# Patient Record
Sex: Female | Born: 2000 | Race: White | Hispanic: No
Health system: Southern US, Community
[De-identification: ages and names within clinical notes are randomized; demographics above are authoritative.]

---

## 2020-05-09 ENCOUNTER — Emergency Department (HOSPITAL_COMMUNITY)
Admission: EM | Admit: 2020-05-09 | Discharge: 2020-05-10 | Disposition: A | Discharge status: Home / Self Care | Payer: BC Managed Care – PPO | Attending: Emergency Medicine | Admitting: Emergency Medicine

## 2020-05-09 ENCOUNTER — Other Ambulatory Visit: Payer: Self-pay

## 2020-05-09 DIAGNOSIS — R42 Dizziness and giddiness: Secondary | ICD-10-CM | POA: Diagnosis present

## 2020-05-09 DIAGNOSIS — R110 Nausea: Secondary | ICD-10-CM | POA: Diagnosis not present

## 2020-05-09 LAB — COMPREHENSIVE METABOLIC PANEL
ALT: 22 U/L (ref 0–44)
AST: 20 U/L (ref 15–41)
Albumin: 4 g/dL (ref 3.5–5.0)
Alkaline Phosphatase: 65 U/L (ref 38–126)
Anion gap: 10 (ref 5–15)
BUN: 6 mg/dL (ref 6–20)
CO2: 24 mmol/L (ref 22–32)
Calcium: 9.5 mg/dL (ref 8.9–10.3)
Chloride: 101 mmol/L (ref 98–111)
Creatinine, Ser: 0.65 mg/dL (ref 0.44–1.00)
GFR, Estimated: >60 mL/min (ref >60)
Glucose, Bld: 98 mg/dL (ref 70–99)
Potassium: 3.7 mmol/L (ref 3.5–5.1)
Sodium: 135 mmol/L (ref 135–145)
Total Bilirubin: 0.5 mg/dL (ref 0.3–1.2)
Total Protein: 7.3 g/dL (ref 6.5–8.1)

## 2020-05-09 LAB — CBC
HCT: 42.9 % (ref 36.0–46.0)
Hemoglobin: 13.9 g/dL (ref 12.0–15.0)
MCH: 29 pg (ref 26.0–34.0)
MCHC: 32.4 g/dL (ref 30.0–36.0)
MCV: 89.4 fL (ref 80.0–100.0)
Platelets: 431 10*3/uL — ABNORMAL HIGH (ref 150–400)
RBC: 4.8 MIL/uL (ref 3.87–5.11)
RDW: 12.4 % (ref 11.5–15.5)
WBC: 11.4 10*3/uL — ABNORMAL HIGH (ref 4.0–10.5)
nRBC: 0 % (ref 0.0–0.2)

## 2020-05-09 LAB — I-STAT BETA HCG BLOOD, ED (MC, WL, AP ONLY): I-stat hCG, quantitative: <5 m[IU]/mL (ref <5)

## 2020-05-09 MED ORDER — ONDANSETRON 4 MG PO TBDP
4.0000 mg | ORAL_TABLET | Freq: Once | ORAL | Status: AC | PRN
  Administered 2020-05-09: 4 mg via ORAL
  Filled 2020-05-09: qty 1

## 2020-05-09 NOTE — ED Provider Notes (Addendum)
[facility] EMERGENCY DEPARTMENT Provider Note   CSN: [id number] Arrival date & time: 05/09/20  1712     History Chief Complaint  Patient presents with  . Dizziness  . Nausea    Wanda Wells is a 20 y.o. female with no pertinent past medical history.  Patient presents with chief complaint of dizziness.  Patient reports that her dizziness started approximately 1500 today.  Patient states that she went for a walk around [facility] for an hour and after returning to her room developed dizziness.  Patient reports that she had associated nausea and felt like her coordination was off.  Patient reports that at this time she also noticed a "knot," on her scalp.  Patient reports that this knot was tender to palpation.  Patient reports that her symptoms have been persistent since onset however have gradually improved.  Dizziness is worse whenever she moves her head or changes position.  Patient denies any facial asymmetry, numbness, weakness, seizures, loss of consciousness, headache, tremors, chest pain, shortness of breath, abdominal pain, vomiting.  HPI     No past medical history on file.  There are no problems to display for this patient.     OB History   No obstetric history on file.     No family history on file.     Home Medications Prior to Admission medications   Not on File    Allergies    Hydrocodone  Review of Systems   Review of Systems  Constitutional: Negative for chills and fever.  Eyes: Positive for visual disturbance.  Respiratory: Negative for shortness of breath.   Cardiovascular: Negative for chest pain and leg swelling.  Gastrointestinal: Positive for nausea. Negative for abdominal pain and vomiting.  Genitourinary: Negative for difficulty urinating, dysuria, frequency and hematuria.  Musculoskeletal: Negative for back pain and neck pain.  Skin: Negative for color change and rash.  Neurological: Positive for dizziness. Negative  for tremors, seizures, syncope, facial asymmetry, speech difficulty, weakness, light-headedness, numbness and headaches.  Psychiatric/Behavioral: Negative for confusion.    Physical Exam Updated Vital Signs BP (!) 147/95 (BP Location: Left Arm)   Pulse 89   Temp 99 F (37.2 C) (Oral)   Resp 16   SpO2 100%   Physical Exam Vitals and nursing note reviewed.  Constitutional:      General: She is not in acute distress.    Appearance: She is not ill-appearing, toxic-appearing or diaphoretic.  HENT:     Head: Normocephalic and atraumatic. No raccoon eyes, abrasion, contusion, masses, right periorbital erythema, left periorbital erythema or laceration.     Jaw: No trismus or pain on movement.     Comments: Patient has minimal erythema to top of scalp with tenderness to palpation.  No swelling, fluctuance, induration, wound, or contusion.    Mouth/Throat:     Pharynx: Oropharynx is clear. Uvula midline. No pharyngeal swelling, oropharyngeal exudate, posterior oropharyngeal erythema or uvula swelling.  Eyes:     General: No scleral icterus.       Right eye: No discharge.        Left eye: No discharge.     Extraocular Movements: Extraocular movements intact.     Conjunctiva/sclera:     Right eye: Right conjunctiva is not injected. No chemosis, exudate or hemorrhage.    Left eye: Left conjunctiva is not injected. No chemosis, exudate or hemorrhage.    Pupils: Pupils are equal, round, and reactive to light.  Cardiovascular:     Rate  and Rhythm: Normal rate.  Pulmonary:     Effort: Pulmonary effort is normal. No tachypnea, bradypnea or respiratory distress.     Breath sounds: Normal breath sounds. No stridor.  Abdominal:     Palpations: Abdomen is soft.     Tenderness: There is no abdominal tenderness.  Musculoskeletal:     Cervical back: Normal range of motion and neck supple. No swelling, edema, deformity, erythema, signs of trauma, lacerations, rigidity, spasms, torticollis,  tenderness, bony tenderness or crepitus. No pain with movement. Normal range of motion.     Right lower leg: No swelling or tenderness. No edema.     Left lower leg: No swelling or tenderness. No edema.  Skin:    General: Skin is warm and dry.  Neurological:     General: No focal deficit present.     Mental Status: She is alert.     GCS: GCS eye subscore is 4. GCS verbal subscore is 5. GCS motor subscore is 6.     Cranial Nerves: No cranial nerve deficit or facial asymmetry.     Sensory: Sensation is intact.     Motor: No weakness, tremor or seizure activity.     Coordination: Romberg sign negative. Finger-Nose-Finger Test normal.     Gait: Gait abnormal.     Comments: CN II-XII intact, equal grip strength, +5 strength to bilateral upper and lower extremities   Patient's gait is unsteady she is cautious to move her head    Psychiatric:        Behavior: Behavior is cooperative.     ED Results / Procedures / Treatments   Labs (all labs ordered are listed, but only abnormal results are displayed) Labs Reviewed  CBC - Abnormal; Notable for the following components:      Result Value   WBC 11.4 (*)    Platelets 431 (*)    All other components within normal limits  COMPREHENSIVE METABOLIC PANEL  I-STAT BETA HCG BLOOD, ED ([facility], [facility], [facility] ONLY)    EKG EKG Interpretation  Date/Time:  Thursday May 09 2020 18:09:22 EDT Ventricular Rate:  83 PR Interval:  122 QRS Duration: 92 QT Interval:  364 QTC Calculation: 427 R Axis:   23 Text Interpretation: Sinus rhythm with marked sinus arrhythmia Cannot rule out Anterior infarct , age undetermined Abnormal ECG No previous ECGs available Confirmed by Alvira Monday ([id number]) on 05/09/2020 9:03:55 PM   Radiology No results found.  Procedures Procedures   Medications Ordered in ED Medications  ondansetron (ZOFRAN-ODT) disintegrating tablet 4 mg (4 mg Oral Given 05/09/20 1801)    ED Course  I have reviewed the triage vital signs and  the nursing notes.  Pertinent labs & imaging results that were available during my care of the patient were reviewed by me and considered in my medical decision making (see chart for details).    MDM Rules/Calculators/A&P                          Alert 20 year old female no acute distress, nontoxic-appearing.  Patient presents with chief complaint of dizziness.  She reports associated nausea and decreased coordination with the onset of dizziness.  Patient also reports that she noticed a "knot," to the top of her head when this dizziness started.  Patient reports that dizziness is worse with movement.  On physical exam patient has no focal neurological deficits.  Patient does have a unsteady gait.  She is cautious to move her head  due to complaints of worsening dizziness.  Lab work and EKG obtained while patient was in triage.  CBC shows slight leukocytosis at 11.4, no signs of anemia. CMP is unremarkable. Pregnancy test negative. EKG shows sinus rhythm with sinus arrhythmia.  On serial reexaminations patient is in no acute distress.  Patient able to move all limbs equally, no facial asymmetry or slurred speech.  Patient was discussed with and evaluated by Dr. Dalene Seltzer who recommended, MRI brain with and without contrast.  MRI pending at this time.  If MRI is unremarkable will discharge patient with trial of meclizine and follow-up with PCP.  Patient care transferred to PA Sanders at the end of my shift. Patient presentation, ED course, and plan of care discussed with review of all pertinent labs and imaging. Please see his/her note for further details regarding further ED course and disposition.   Final Clinical Impression(s) / ED Diagnoses Final diagnoses:  None    Rx / DC Orders ED Discharge Orders    None       Haskel Schroeder, PA-C 05/10/20 [address] [place] 05/10/20 1032    Alvira Monday, MD 05/10/20 [phone]

## 2020-05-09 NOTE — ED Provider Notes (Signed)
Emergency Medicine Provider Triage Evaluation Note  Wanda Wells, a 20 y.o. female evaluatedin triage.  Pt complains of dizziness.  Walked around campus and made it worse.  Also has some nausea.  No syncope, no CP or SOB.  BP (!) 147/95 (BP Location: Left Arm)   Pulse 89   Temp 99 F (37.2 C) (Oral)   Resp 16   SpO2 100%   Patient is alert, no acute distress, normal work of breathing    Medically screening exam initiated at 6:01 PM. Appropriate orders placed.  Oaklee Esther was informed that the remainder of the evaluation will be completed by another provider, this initial triage assessment does not replace that evaluation, and the importance of remaining in the ED until their evaluation is complete.       Valetta Mulroy, [place] N, PA-C 05/09/20 Murvin Natal, MD 05/13/20 [phone]

## 2020-05-09 NOTE — ED Triage Notes (Addendum)
Pt reports an "knot on head" and dizziness and states she was walking outside around campus and went inside and states a really bad pain "burning"  where the knot is and states she has not been able to walk due to dizziness/ nausea

## 2020-05-10 ENCOUNTER — Emergency Department (HOSPITAL_COMMUNITY): Payer: BC Managed Care – PPO

## 2020-05-10 MED ORDER — ONDANSETRON 4 MG PO TBDP: 4.0000 mg | ORAL_TABLET | Freq: Three times a day (TID) | ORAL | 0 refills | Status: AC | PRN

## 2020-05-10 MED ORDER — MECLIZINE HCL 25 MG PO TABS: 25.0000 mg | ORAL_TABLET | Freq: Three times a day (TID) | ORAL | 0 refills | Status: AC | PRN

## 2020-05-10 MED ORDER — GADOBUTROL 1 MMOL/ML IV SOLN
9.0000 mL | Freq: Once | INTRAVENOUS | Status: AC | PRN
  Administered 2020-05-10: 9 mL via INTRAVENOUS

## 2020-05-10 NOTE — ED Notes (Signed)
Patient taken to MRI

## 2020-05-10 NOTE — ED Provider Notes (Signed)
Assumed care of patient at shift change from [facility].  See prior notes for full H&P.  Briefly, 20 y.o. F here with nausea/dizziness.  No focal deficits but some concern for unsteady gait.  Labs reassuring.  Plan:  MRI brain pending.  dispo per results.  Results for orders placed or performed during the hospital encounter of 05/09/20  Comprehensive metabolic panel  Result Value Ref Range   Sodium 135 135 - 145 mmol/L   Potassium 3.7 3.5 - 5.1 mmol/L   Chloride 101 98 - 111 mmol/L   CO2 24 22 - 32 mmol/L   Glucose, Bld 98 70 - 99 mg/dL   BUN 6 6 - 20 mg/dL   Creatinine, Ser 8.45 0.44 - 1.00 mg/dL   Calcium 9.5 8.9 - 36.4 mg/dL   Total Protein 7.3 6.5 - 8.1 g/dL   Albumin 4.0 3.5 - 5.0 g/dL   AST 20 15 - 41 U/L   ALT 22 0 - 44 U/L   Alkaline Phosphatase 65 38 - 126 U/L   Total Bilirubin 0.5 0.3 - 1.2 mg/dL   GFR, Estimated >68 >03 mL/min   Anion gap 10 5 - 15  CBC  Result Value Ref Range   WBC 11.4 (H) 4.0 - 10.5 K/uL   RBC 4.80 3.87 - 5.11 MIL/uL   Hemoglobin 13.9 12.0 - 15.0 g/dL   HCT 21.2 24.8 - 25.0 %   MCV 89.4 80.0 - 100.0 fL   MCH 29.0 26.0 - 34.0 pg   MCHC 32.4 30.0 - 36.0 g/dL   RDW 03.7 04.8 - 88.9 %   Platelets 431 (H) 150 - 400 K/uL   nRBC 0.0 0.0 - 0.2 %  I-Stat beta hCG blood, ED  Result Value Ref Range   I-stat hCG, quantitative <5.0 <5 mIU/mL   Comment 3           MR Brain W and Wo Contrast  Result Date: 05/10/2020 CLINICAL DATA:  Initial evaluation for acute vertigo. EXAM: MRI HEAD WITHOUT AND WITH CONTRAST TECHNIQUE: Multiplanar, multiecho pulse sequences of the brain and surrounding structures were obtained without and with intravenous contrast. CONTRAST:  26mL GADAVIST GADOBUTROL 1 MMOL/ML IV SOLN COMPARISON:  None. FINDINGS: Brain: Cerebral volume within normal limits for patient age. No focal parenchymal signal abnormality identified. No abnormal foci of restricted diffusion to suggest acute or subacute ischemia. Gray-white matter differentiation  well maintained. No encephalomalacia to suggest chronic infarction. No foci of susceptibility artifact to suggest acute or chronic intracranial hemorrhage. No mass lesion, midline shift or mass effect. No hydrocephalus. No extra-axial fluid collection. Major dural sinuses are grossly patent. Empty sella. Suprasellar region normal. Midline structures intact and otherwise normal. No abnormal enhancement. Vascular: Major intracranial vascular flow voids well maintained and normal in appearance. Skull and upper cervical spine: Craniocervical junction normal. Visualized upper cervical spine within normal limits. Diffusely decreased T1 signal intensity seen within the visualized bone marrow, nonspecific, but most commonly related to anemia, smoking, or obesity. No focal marrow replacing lesion. No scalp soft tissue abnormality. Sinuses/Orbits: Globes and orbital soft tissues within normal limits. Paranasal sinuses are clear. No mastoid effusion. Inner ear structures normal. Other: None. IMPRESSION: 1. No acute intracranial abnormality. 2. Empty sella. While this finding is often incidental in nature and of no clinical significance, this can also be seen in the setting of idiopathic intracranial hypertension. 3. Otherwise unremarkable and normal brain MRI. Electronically Signed   By: Rise Mu M.D.   On: 05/10/2020 02:40  MRI without acute findings.  Suspect peripheral vertigo.  Will discharge home with meclizine and neurology follow-up if needed.  Return here for new concerns.   Garlon Hatchet, PA-C 05/10/20 0251    Pollyann Savoy, MD 05/10/20 [phone]

## 2020-05-10 NOTE — ED Notes (Signed)
Patient returned from MRI.

## 2020-05-10 NOTE — Discharge Instructions (Signed)
Take the prescribed medication as directed. Follow-up with neurology if symptoms not improving. Return to the ED for new or worsening symptoms.

## 2022-07-04 IMAGING — MR MR HEAD WO/W CM
14 of 16 series · 40 of 48 positions shown · IV contrast (gadavist)
Comparison: None.

CLINICAL DATA: Initial evaluation for acute vertigo.

EXAM:
MRI HEAD WITHOUT AND WITH CONTRAST
TECHNIQUE: Multiplanar, multiecho pulse sequences of the brain and surrounding
structures were obtained without and with intravenous contrast.
CONTRAST:  9mL GADAVIST GADOBUTROL 1 MMOL/ML IV SOLN

[Series 5: DWI · axial · 3.0mm · 0.88mm/px · z∈[-124,+15]mm · 6 of 96 slices shown (1 of 4)]
[im 1/96]
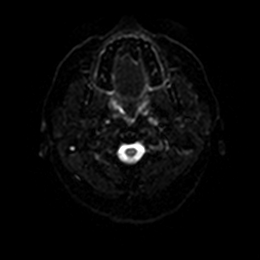
[im 20/96]
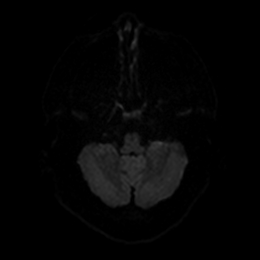
[im 39/96]
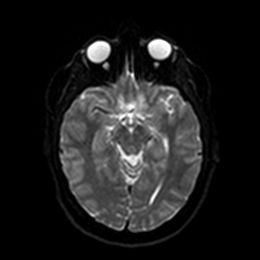
[im 58/96]
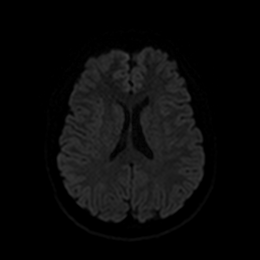
[im 77/96]
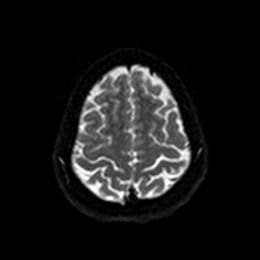
[im 96/96]
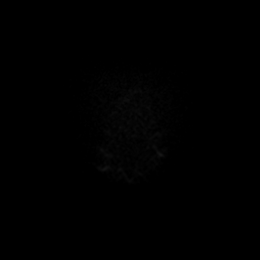

[Series 6: DWI · axial · 3.0mm · 0.88mm/px · z∈[-124,+15]mm · 2 of 48 slices shown (2 of 4)]
[im 1/48]
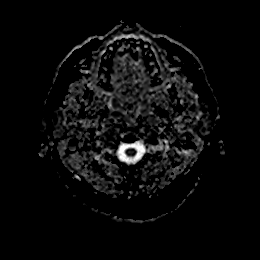
[im 48/48]
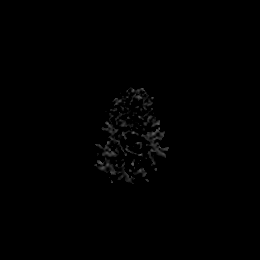

[Series 7: DWI · coronal · 4.0mm · 0.88mm/px · 4 of 68 slices shown (3 of 4)]
[im 1/68]
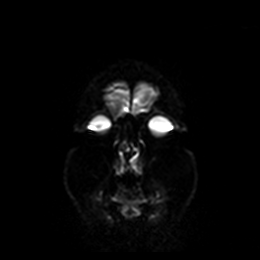
[im 23/68]
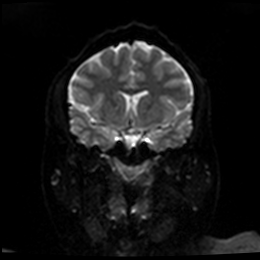
[im 45/68]
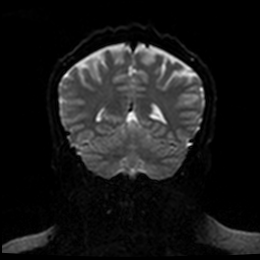
[im 68/68]
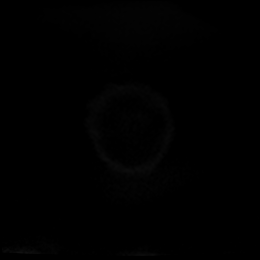

[Series 8: DWI · coronal · 4.0mm · 0.88mm/px · 2 of 34 slices shown (4 of 4)]
[im 1/34]
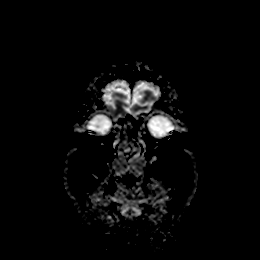
[im 34/34]
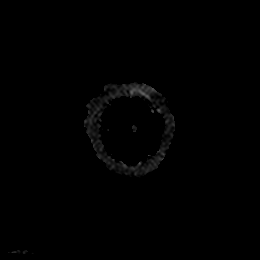

[Series 9: T1 · sagittal · 5.0mm · 0.75mm/px · 2 of 23 slices shown]
[im 1/23]
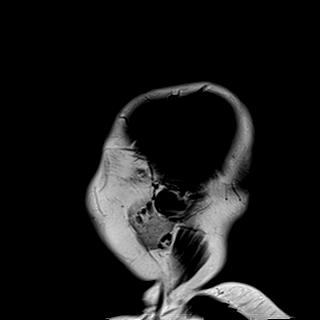
[im 23/23]
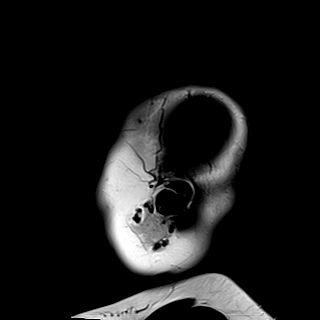

[Series 10: T2 · axial · 5.0mm · 0.72mm/px · z∈[-126,+17]mm · 2 of 25 slices shown]
[im 1/25]
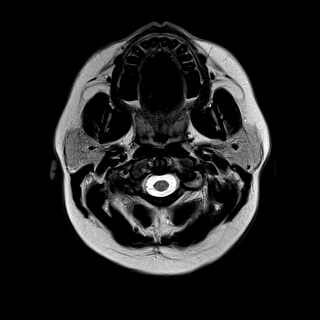
[im 25/25]
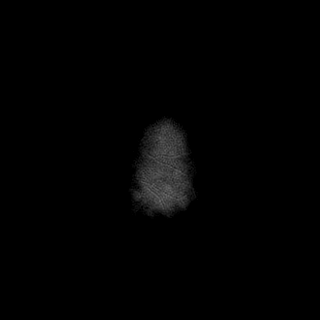

[Series 11: FLAIR · axial · 5.0mm · 0.45mm/px · z∈[-125,+17]mm · 2 of 25 slices shown]
[im 1/25]
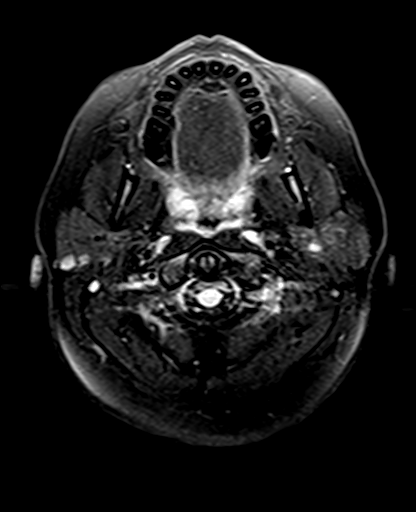
[im 25/25]
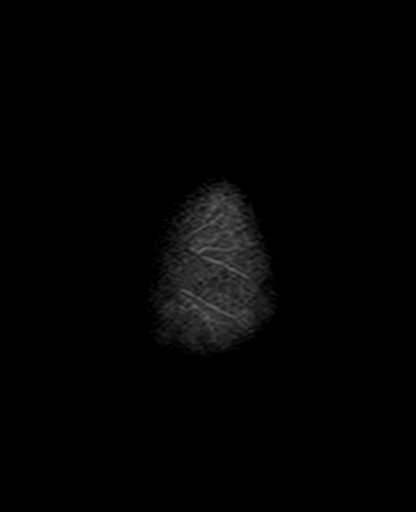

[Series 12: mag_images · axial · 3.0mm · 0.90mm/px · z∈[-130,+22]mm · 4 of 52 slices shown]
[im 1/52]
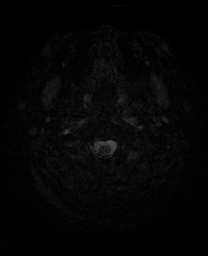
[im 18/52]
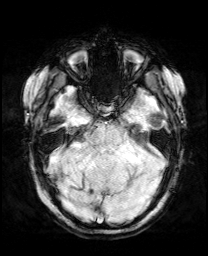
[im 35/52]
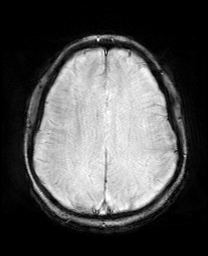
[im 52/52]
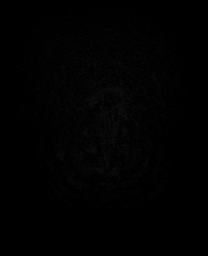

[Series 13: pha_images · axial · 3.0mm · 0.90mm/px · z∈[-130,+16]mm · 3 of 50 slices shown]
[im 1/50]
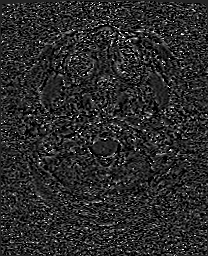
[im 25/50]
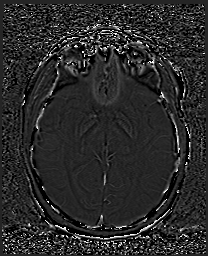
[im 50/50]
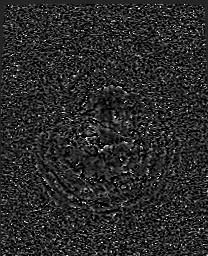

[Series 14: swi_images · axial · 3.0mm · 0.90mm/px · z∈[-130,+22]mm · 4 of 52 slices shown]
[im 1/52]
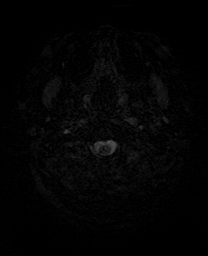
[im 18/52]
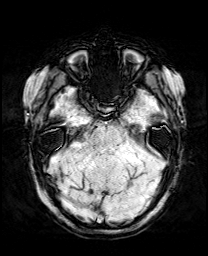
[im 35/52]
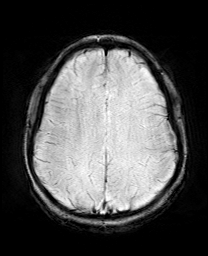
[im 52/52]
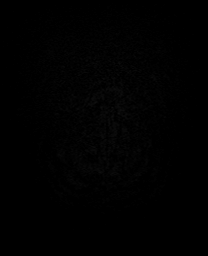

[Series 15: mip_images(sw) · axial · 24.0mm · 0.90mm/px · z∈[-119,+11]mm · 3 of 45 slices shown]
[im 1/45]
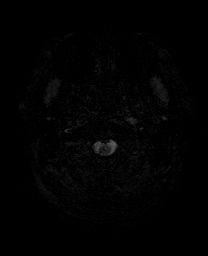
[im 23/45]
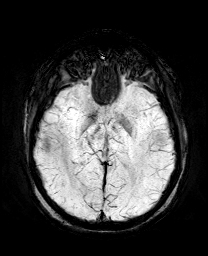
[im 45/45]
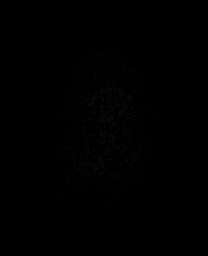

[Series 17: T2 post-contrast · coronal · 5.0mm · 0.72mm/px · 2 of 28 slices shown]
[im 1/28]
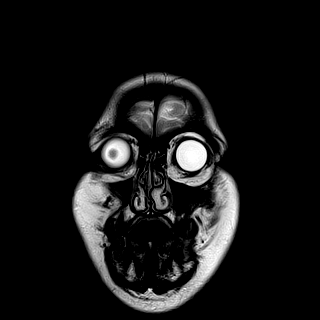
[im 28/28]
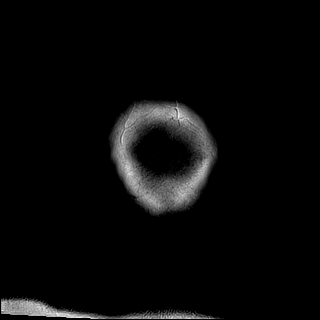

[Series 19: T1 post-contrast · coronal · 5.0mm · 0.34mm/px · 2 of 28 slices shown (1 of 2)]
[im 1/28]
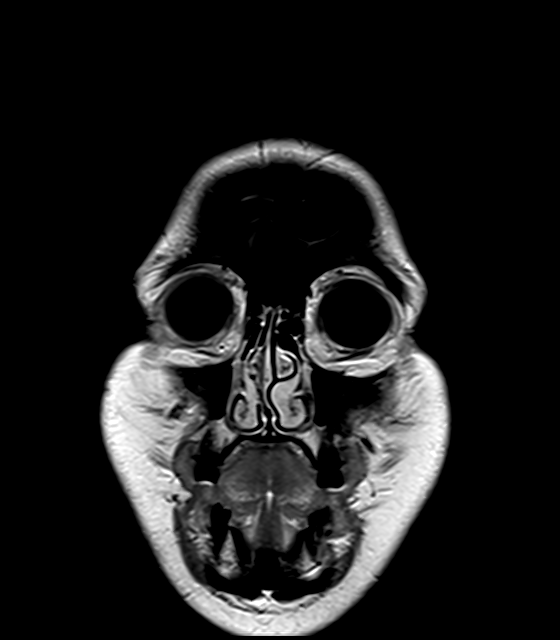
[im 28/28]
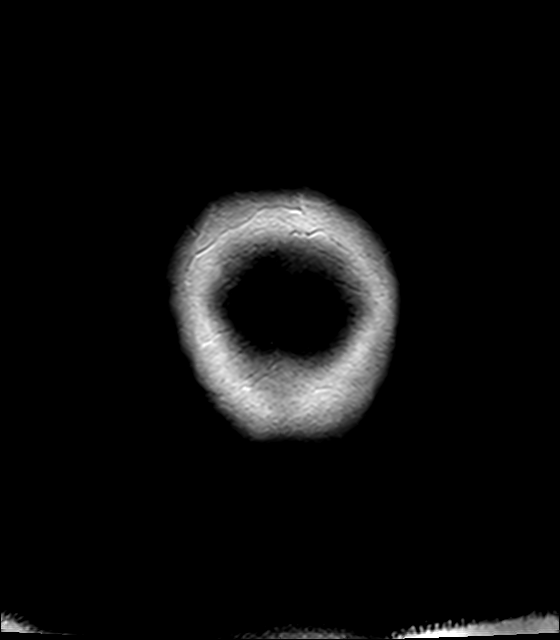

[Series 20: T1 post-contrast · sagittal · 5.0mm · 0.72mm/px · 2 of 23 slices shown (2 of 2)]
[im 1/23]
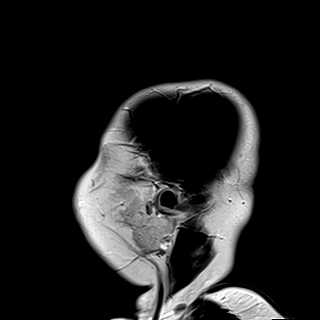
[im 23/23]
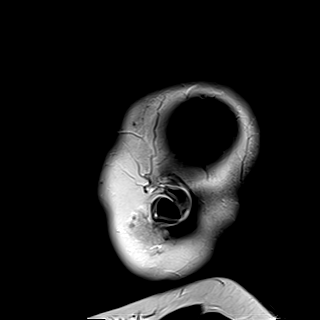

[40 of 48 positions shown; findings below may reference images not displayed]

FINDINGS: Brain: Cerebral volume within normal limits for patient age. No
focal parenchymal signal abnormality identified.

No abnormal foci of restricted diffusion to suggest acute or
subacute ischemia. Gray-white matter differentiation well
maintained. No encephalomalacia to suggest chronic infarction. No
foci of susceptibility artifact to suggest acute or chronic
intracranial hemorrhage.

No mass lesion, midline shift or mass effect. No hydrocephalus. No
extra-axial fluid collection. Major dural sinuses are grossly
patent.

Empty sella. Suprasellar region normal. Midline structures intact
and otherwise normal.

No abnormal enhancement.

Vascular: Major intracranial vascular flow voids well maintained and
normal in appearance.

Skull and upper cervical spine: Craniocervical junction normal.
Visualized upper cervical spine within normal limits. Diffusely
decreased T1 signal intensity seen within the visualized bone
marrow, nonspecific, but most commonly related to anemia, smoking,
or obesity. No focal marrow replacing lesion. No scalp soft tissue
abnormality.

Sinuses/Orbits: Globes and orbital soft tissues within normal
limits.

Paranasal sinuses are clear. No mastoid effusion. Inner ear
structures normal.

Other: None.
IMPRESSION: 1. No acute intracranial abnormality.
2. Empty sella. While this finding is often incidental in nature and
of no clinical significance, this can also be seen in the setting of
idiopathic intracranial hypertension.
3. Otherwise unremarkable and normal brain MRI.

## 2022-08-04 ENCOUNTER — Other Ambulatory Visit: Payer: Self-pay

## 2022-08-04 ENCOUNTER — Emergency Department (HOSPITAL_BASED_OUTPATIENT_CLINIC_OR_DEPARTMENT_OTHER)
Admission: EM | Admit: 2022-08-04 | Discharge: 2022-08-04 | Disposition: A | Discharge status: Home / Self Care | Payer: BC Managed Care – PPO | Attending: Emergency Medicine | Admitting: Emergency Medicine

## 2022-08-04 DIAGNOSIS — I10 Essential (primary) hypertension: Secondary | ICD-10-CM | POA: Insufficient documentation

## 2022-08-04 DIAGNOSIS — R519 Headache, unspecified: Secondary | ICD-10-CM | POA: Insufficient documentation

## 2022-08-04 LAB — CBC WITH DIFFERENTIAL/PLATELET
Abs Immature Granulocytes: 0.05 10*3/uL (ref 0.00–0.07)
Basophils Absolute: 0.1 10*3/uL (ref 0.0–0.1)
Basophils Relative: 1 %
Eosinophils Absolute: 0.1 10*3/uL (ref 0.0–0.5)
Eosinophils Relative: 1 %
HCT: 43.1 % (ref 36.0–46.0)
Hemoglobin: 14.2 g/dL (ref 12.0–15.0)
Immature Granulocytes: 1 %
Lymphocytes Relative: 23 %
Lymphs Abs: 2.5 10*3/uL (ref 0.7–4.0)
MCH: 30.1 pg (ref 26.0–34.0)
MCHC: 32.9 g/dL (ref 30.0–36.0)
MCV: 91.3 fL (ref 80.0–100.0)
Monocytes Absolute: 0.6 10*3/uL (ref 0.1–1.0)
Monocytes Relative: 6 %
Neutro Abs: 7.6 10*3/uL (ref 1.7–7.7)
Neutrophils Relative %: 68 %
Platelets: 359 10*3/uL (ref 150–400)
RBC: 4.72 MIL/uL (ref 3.87–5.11)
RDW: 13.3 % (ref 11.5–15.5)
WBC: 10.8 10*3/uL — ABNORMAL HIGH (ref 4.0–10.5)
nRBC: 0 % (ref 0.0–0.2)

## 2022-08-04 LAB — BASIC METABOLIC PANEL
Anion gap: 10 (ref 5–15)
BUN: 11 mg/dL (ref 6–20)
CO2: 21 mmol/L — ABNORMAL LOW (ref 22–32)
Calcium: 9.4 mg/dL (ref 8.9–10.3)
Chloride: 107 mmol/L (ref 98–111)
Creatinine, Ser: 0.91 mg/dL (ref 0.44–1.00)
GFR, Estimated: >60 mL/min (ref >60)
Glucose, Bld: 147 mg/dL — ABNORMAL HIGH (ref 70–99)
Potassium: 3.7 mmol/L (ref 3.5–5.1)
Sodium: 138 mmol/L (ref 135–145)

## 2022-08-04 NOTE — ED Provider Notes (Signed)
[place] EMERGENCY DEPARTMENT AT [facility] Provider Note   CSN: [id number] Arrival date & time: 08/04/22  1002     History  Chief Complaint  Patient presents with   Headache   Hypertension    Wanda Wells is a 22 y.o. female.  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  Patient followed by neurology at [facility].  They also feel that she has a component of migraine headaches.  Patient's had elevated opening LP pressures had MRIs..  Today's headache very similar to what she has had in the past.  Seems to be more migraine-like.  Feeling much better currently.  Patient got concerned because her pressures were high at home I systolics 170s she was worried about potential stroke.  Blood pressure here is 128/88.  Patient denies any neurodeficits.  Other than some light sensitivity.  Headache currently has been more to the left side of the face.  Patient does state that she is feeling much better.  Has not felt very well the past few days.       Home Medications Prior to Admission medications   Medication Sig Start Date End Date Taking? Authorizing Provider  cetirizine (ZYRTEC) 10 MG tablet Take 10 mg by mouth daily as needed for allergies.    [provider]  etonogestrel (NEXPLANON) 68 MG IMPL implant Inject 1 each into the skin.    [provider]  ibuprofen (ADVIL) 200 MG tablet Take 400 mg by mouth every 6 (six) hours as needed for headache or moderate pain.    [provider]  meclizine (ANTIVERT) 25 MG tablet Take 1 tablet (25 mg total) by mouth 3 (three) times daily as needed for dizziness. 05/10/20   Garlon Hatchet, PA-C  ondansetron (ZOFRAN ODT) 4 MG disintegrating tablet Take 1 tablet (4 mg total) by mouth every 8 (eight) hours as needed for nausea. 05/10/20   Garlon Hatchet, PA-C      Allergies    Cinnamon, Hydrocodone, and Erythromycin    Review of Systems   Review of Systems  Constitutional:   Negative for chills and fever.  HENT:  Negative for ear pain and sore throat.   Eyes:  Positive for photophobia. Negative for pain and visual disturbance.  Respiratory:  Negative for cough and shortness of breath.   Cardiovascular:  Negative for chest pain and palpitations.  Gastrointestinal:  Negative for abdominal pain and vomiting.  Genitourinary:  Negative for dysuria and hematuria.  Musculoskeletal:  Negative for arthralgias and back pain.  Skin:  Negative for color change and rash.  Neurological:  Positive for headaches. Negative for seizures and syncope.  All other systems reviewed and are negative.   Physical Exam Updated Vital Signs BP 118/89 (BP Location: Right Arm)   Pulse 87   Temp 98.5 F (36.9 C)   Resp 15   Ht 1.499 m (4\' 11" )   Wt 105.2 kg   LMP 07/13/2022   SpO2 98%   BMI 46.86 kg/m  Physical Exam Vitals and nursing note reviewed.  Constitutional:      General: She is not in acute distress.    Appearance: Normal appearance. She is well-developed.  HENT:     Head: Normocephalic and atraumatic.  Eyes:     Extraocular Movements: Extraocular movements intact.     Conjunctiva/sclera: Conjunctivae normal.     Pupils: Pupils are equal, round, and reactive to light.  Cardiovascular:     Rate  and Rhythm: Normal rate and regular rhythm.     Heart sounds: No murmur heard. Pulmonary:     Effort: Pulmonary effort is normal. No respiratory distress.     Breath sounds: Normal breath sounds.  Abdominal:     Palpations: Abdomen is soft.     Tenderness: There is no abdominal tenderness.  Musculoskeletal:        General: No swelling.     Cervical back: Normal range of motion and neck supple. No rigidity.  Skin:    General: Skin is warm and dry.     Capillary Refill: Capillary refill takes less than 2 seconds.  Neurological:     General: No focal deficit present.     Mental Status: She is alert and oriented to person, place, and time.     Cranial Nerves: No cranial  nerve deficit.     Sensory: No sensory deficit.     Motor: No weakness.  Psychiatric:        Mood and Affect: Mood normal.     ED Results / Procedures / Treatments   Labs (all labs ordered are listed, but only abnormal results are displayed) Labs Reviewed  CBC WITH DIFFERENTIAL/PLATELET - Abnormal; Notable for the following components:      Result Value   WBC 10.8 (*)    All other components within normal limits  BASIC METABOLIC PANEL - Abnormal; Notable for the following components:   CO2 21 (*)    Glucose, Bld 147 (*)    All other components within normal limits    EKG None  Radiology No results found.  Procedures Procedures    Medications Ordered in ED Medications - No data to display  ED Course/ Medical Decision Making/ A&P                             Medical Decision Making Amount and/or Complexity of Data Reviewed Labs: ordered.   Patient currently still has some headache but markedly improved.  Blood pressure is much better here.  No focal neurodeficit.  Given that patient already has a history of IAH and the history of migraines feel that patient does not need additional workup with her feeling better and blood pressures being good here.  She can follow-up with her [facility] neurologist.  Continue her Topamax.  No fevers.  Patient's labs here mild leukocytosis white count 10.8 hemoglobin 14.2 basic metabolic panel is normal renal functions normal.  Glucose 147.   Final Clinical Impression(s) / ED Diagnoses Final diagnoses:  Acute intractable headache, unspecified headache type    Rx / DC Orders ED Discharge Orders     None         Vanetta Mulders, MD 08/04/22 1209

## 2022-08-04 NOTE — ED Notes (Signed)
Pt d/c home per MD order, discharge summary reviewed, pt verbalizes understanding. Ambulatory off unit. No s/s of acute distress noted at discharge.  

## 2022-08-04 NOTE — Discharge Instructions (Signed)
Follow-up with your allergist at [facility].  Return for any new or worse symptoms.  Blood pressure now doing much better here.  Suspect there has been a degree of sort of migraine headache the past few days.  Glad you are feeling much better.  Continue to take your Topamax for your IHS.

## 2022-08-04 NOTE — ED Triage Notes (Signed)
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 Concerned today because took blood pressure at home yesterday and was elevated. 176/106. Pt very anxious in triage.
